# Patient Record
Sex: Male | Born: 1995
Health system: Southern US, Community
[De-identification: ages and names within clinical notes are randomized; demographics above are authoritative.]

---

## 2019-12-05 ENCOUNTER — Other Ambulatory Visit: Payer: Self-pay

## 2019-12-05 ENCOUNTER — Emergency Department (HOSPITAL_COMMUNITY)
Admission: EM | Admit: 2019-12-05 | Discharge: 2019-12-05 | Disposition: A | Discharge status: Home / Self Care | Payer: BC Managed Care – PPO | Attending: Emergency Medicine | Admitting: Emergency Medicine

## 2019-12-05 DIAGNOSIS — R10819 Abdominal tenderness, unspecified site: Secondary | ICD-10-CM | POA: Insufficient documentation

## 2019-12-05 DIAGNOSIS — R1010 Upper abdominal pain, unspecified: Secondary | ICD-10-CM | POA: Diagnosis not present

## 2019-12-05 DIAGNOSIS — R109 Unspecified abdominal pain: Secondary | ICD-10-CM

## 2019-12-05 DIAGNOSIS — R1011 Right upper quadrant pain: Secondary | ICD-10-CM | POA: Diagnosis not present

## 2019-12-05 LAB — URINALYSIS, ROUTINE W REFLEX MICROSCOPIC
Bilirubin Urine: NEGATIVE
Glucose, UA: NEGATIVE mg/dL
Hgb urine dipstick: NEGATIVE
Ketones, ur: NEGATIVE mg/dL
Leukocytes,Ua: NEGATIVE
Nitrite: NEGATIVE
Protein, ur: NEGATIVE mg/dL
Specific Gravity, Urine: 1.005 (ref 1.005–1.030)
pH: 7 (ref 5.0–8.0)

## 2019-12-05 LAB — COMPREHENSIVE METABOLIC PANEL
ALT: 42 U/L (ref 0–44)
AST: 26 U/L (ref 15–41)
Albumin: 4.4 g/dL (ref 3.5–5.0)
Alkaline Phosphatase: 56 U/L (ref 38–126)
Anion gap: 9 (ref 5–15)
BUN: 13 mg/dL (ref 6–20)
CO2: 28 mmol/L (ref 22–32)
Calcium: 9.1 mg/dL (ref 8.9–10.3)
Chloride: 103 mmol/L (ref 98–111)
Creatinine, Ser: 1.05 mg/dL (ref 0.61–1.24)
GFR calc Af Amer: >60 mL/min (ref >60)
GFR calc non Af Amer: >60 mL/min (ref >60)
Glucose, Bld: 93 mg/dL (ref 70–99)
Potassium: 3.4 mmol/L — ABNORMAL LOW (ref 3.5–5.1)
Sodium: 140 mmol/L (ref 135–145)
Total Bilirubin: 1.2 mg/dL (ref 0.3–1.2)
Total Protein: 7.6 g/dL (ref 6.5–8.1)

## 2019-12-05 LAB — CBC
HCT: 43.1 % (ref 39.0–52.0)
Hemoglobin: 14 g/dL (ref 13.0–17.0)
MCH: 29.9 pg (ref 26.0–34.0)
MCHC: 32.5 g/dL (ref 30.0–36.0)
MCV: 91.9 fL (ref 80.0–100.0)
Platelets: 241 10*3/uL (ref 150–400)
RBC: 4.69 MIL/uL (ref 4.22–5.81)
RDW: 11.4 % — ABNORMAL LOW (ref 11.5–15.5)
WBC: 5.6 10*3/uL (ref 4.0–10.5)
nRBC: 0 % (ref 0.0–0.2)

## 2019-12-05 LAB — LIPASE, BLOOD: Lipase: 30 U/L (ref 11–51)

## 2019-12-05 MED ORDER — HYDROCODONE-ACETAMINOPHEN 5-325 MG PO TABS: 1.0000 | ORAL_TABLET | Freq: Four times a day (QID) | ORAL | 0 refills | Status: AC | PRN

## 2019-12-05 NOTE — Discharge Instructions (Signed)
Per our discussion, I would recommend that you continue to use ibuprofen for management of your pain.  I prescribed you a short course of Vicodin if you have any breakthrough pain.  Please do not mix this with alcohol or operate a motor vehicle after taking it.  Please follow-up with [facility] gastroenterology next week.  Their contact information is in this paperwork.  If you develop any new or worsening symptoms feel free to return to the emergency department for reevaluation.  It was a pleasure to meet you.

## 2019-12-05 NOTE — ED Provider Notes (Signed)
[facility] EMERGENCY DEPARTMENT Provider Note   CSN: [id number] Arrival date & time: 12/05/19  1032     History Chief Complaint  Patient presents with  . Abdominal Pain    Peter Rosales is a 24 y.o. male.  HPI Patient is a 24 year old male with no significant medical history that presents due to abdominal pain.  Patient states 4 days ago he began experiencing some mild intermittent upper abdominal pain that was typically worse in the right upper quadrant.  His significant other notes some swelling in the region as well.  He states yesterday it became more of a constant waxing and waning pain.  Is currently mild.  He denies any other symptoms to go along with this pain.  No nausea or vomiting.  No fevers or chills.  No chest pain or shortness of breath.  He denies a history of similar symptoms.  He is not on any regular medications.  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     No past medical history on file.  There are no problems to display for this patient.    No family history on file.  Social History   Tobacco Use  . Smoking status: Not on file  Substance Use Topics  . Alcohol use: Not on file  . Drug use: Not on file    Home Medications Prior to Admission medications   Not on File    Allergies    Patient has no allergy information on record.  Review of Systems   Review of Systems  All other systems reviewed and are negative. Ten systems reviewed and are negative for acute change, except as noted in the HPI.   Physical Exam Updated Vital Signs BP (!) 144/75 (BP Location: Right Arm)   Pulse (!) 56   Temp 98.1 F (36.7 C) (Oral)   Resp 16   Ht 5\' 10"  (1.778 m)   Wt 86.2 kg   SpO2 100%   BMI 27.26 kg/m   Physical Exam Vitals and nursing note reviewed.  Constitutional:      General: He is not in acute distress.    Appearance: Normal appearance. He is not ill-appearing,  toxic-appearing or diaphoretic.  HENT:     Head: Normocephalic and atraumatic.     Right Ear: External ear normal.     Left Ear: External ear normal.     Nose: Nose normal.     Mouth/Throat:     Mouth: Mucous membranes are moist.     Pharynx: Oropharynx is clear. No oropharyngeal exudate or posterior oropharyngeal erythema.  Eyes:     Extraocular Movements: Extraocular movements intact.  Cardiovascular:     Rate and Rhythm: Normal rate and regular rhythm.     Pulses: Normal pulses.     Heart sounds: Normal heart sounds. No murmur heard.  No friction rub. No gallop.   Pulmonary:     Effort: Pulmonary effort is normal. No respiratory distress.     Breath sounds: Normal breath sounds. No stridor. No wheezing, rhonchi or rales.  Abdominal:     General: Abdomen is flat. There is no distension.     Tenderness: There is abdominal tenderness.     Comments: Abdomen is soft.  No rebound.  No guarding.  There is a very mild point of tenderness noted along the right side of the epigastrium.  No edema.  No distention.  No erythema.  Negative Murphy sign.  No obvious signs of herniation.  Musculoskeletal:        General: Normal range of motion.     Cervical back: Normal range of motion and neck supple. No tenderness.  Skin:    General: Skin is warm and dry.  Neurological:     General: No focal deficit present.     Mental Status: He is alert and oriented to person, place, and time.  Psychiatric:        Mood and Affect: Mood normal.        Behavior: Behavior normal.    ED Results / Procedures / Treatments   Labs (all labs ordered are listed, but only abnormal results are displayed) Labs Reviewed  COMPREHENSIVE METABOLIC PANEL - Abnormal; Notable for the following components:      Result Value   Potassium 3.4 (*)    All other components within normal limits  CBC - Abnormal; Notable for the following components:   RDW 11.4 (*)    All other components within normal limits  URINALYSIS,  ROUTINE W REFLEX MICROSCOPIC - Abnormal; Notable for the following components:   Color, Urine STRAW (*)    All other components within normal limits  LIPASE, BLOOD    EKG None  Radiology No results found.  Procedures Procedures (including critical care time)  Medications Ordered in ED Medications - No data to display  ED Course  I have reviewed the triage vital signs and the nursing notes.  Pertinent labs & imaging results that were available during my care of the patient were reviewed by me and considered in my medical decision making (see chart for details).    MDM Rules/Calculators/A&P                          Patient is a 24 year old male that presents with some mild intermittent upper abdominal pain.  Labs were obtained in triage and are reassuring.  UA, lipase, CBC was benign.  Mildly hypokalemic at 3.4.  We discussed supplementing this through his diet.  Patient is afebrile.  Not tachycardic.  His vital signs are stable and reassuring.  Due to the nature of his symptoms and his physical exam being reassuring, discussed not performing any imaging at today's visit.  He was amenable with this.  I'm going to give the patient a GI follow-up.  He plans on reaching out to them in a couple of days.  We'll give patient a short course of Vicodin.  We discussed safety regarding this medication.  I recommended continued use of ibuprofen for regular pain management.  He understands to take this medication with food to prevent stomach irritation.  He was given very strict return precautions.  His questions were answered and he was amicable to time of discharge.  His vital signs are stable.  Patient discharged to home/self care.  Condition at discharge: Stable  Note: Portions of this report may have been transcribed using voice recognition software. Every effort was made to ensure accuracy; however, inadvertent computerized transcription errors may be present.    Final Clinical  Impression(s) / ED Diagnoses Final diagnoses:  Abdominal pain, unspecified abdominal location    Rx / DC Orders ED Discharge Orders         Ordered    HYDROcodone-acetaminophen (NORCO/VICODIN) 5-325 MG tablet  Every 6 hours PRN     Discontinue     12/05/19 1231  Placido Sou, PA-C 12/05/19 1234    Mancel Bale, MD 12/05/19 [phone]

## 2019-12-05 NOTE — ED Triage Notes (Signed)
Pt here for eval of RUQ pain and swelling, worsening since Wednesday. Denies n/v/d.

## 2019-12-31 DIAGNOSIS — R1900 Intra-abdominal and pelvic swelling, mass and lump, unspecified site: Secondary | ICD-10-CM | POA: Diagnosis not present

## 2019-12-31 DIAGNOSIS — S39011A Strain of muscle, fascia and tendon of abdomen, initial encounter: Secondary | ICD-10-CM | POA: Diagnosis not present

## 2020-01-04 ENCOUNTER — Other Ambulatory Visit: Payer: Self-pay | Admitting: Urgent Care

## 2020-01-18 DIAGNOSIS — R1011 Right upper quadrant pain: Secondary | ICD-10-CM | POA: Diagnosis not present

## 2020-01-18 DIAGNOSIS — K625 Hemorrhage of anus and rectum: Secondary | ICD-10-CM | POA: Diagnosis not present

## 2020-01-19 ENCOUNTER — Other Ambulatory Visit: Payer: Self-pay | Admitting: Physician Assistant

## 2020-01-19 DIAGNOSIS — R1011 Right upper quadrant pain: Secondary | ICD-10-CM

## 2020-01-26 ENCOUNTER — Ambulatory Visit
Admission: RE | Admit: 2020-01-26 | Discharge: 2020-01-26 | Disposition: A | Discharge status: Home / Self Care | Payer: BC Managed Care – PPO | Source: Ambulatory Visit | Attending: Physician Assistant | Admitting: Physician Assistant

## 2020-01-26 DIAGNOSIS — R1011 Right upper quadrant pain: Secondary | ICD-10-CM | POA: Diagnosis not present

## 2020-01-27 DIAGNOSIS — K625 Hemorrhage of anus and rectum: Secondary | ICD-10-CM | POA: Diagnosis not present

## 2020-03-01 DIAGNOSIS — Z0000 Encounter for general adult medical examination without abnormal findings: Secondary | ICD-10-CM | POA: Diagnosis not present

## 2020-03-01 DIAGNOSIS — E876 Hypokalemia: Secondary | ICD-10-CM | POA: Diagnosis not present

## 2020-03-01 DIAGNOSIS — Z13220 Encounter for screening for lipoid disorders: Secondary | ICD-10-CM | POA: Diagnosis not present

## 2021-03-13 DIAGNOSIS — Z0000 Encounter for general adult medical examination without abnormal findings: Secondary | ICD-10-CM | POA: Diagnosis not present

## 2021-03-13 DIAGNOSIS — Z13220 Encounter for screening for lipoid disorders: Secondary | ICD-10-CM | POA: Diagnosis not present

## 2021-03-16 IMAGING — US US ABDOMEN LIMITED
1 series · 14 of 25 positions shown · non-contrast
Comparison: None

CLINICAL DATA: RIGHT upper quadrant abdominal pain for 1 month

EXAM:
ULTRASOUND ABDOMEN LIMITED RIGHT UPPER QUADRANT

[Series 1: us abdomen limited · 0.19mm/px · 14 of 47 slices shown]
[im 1/47]
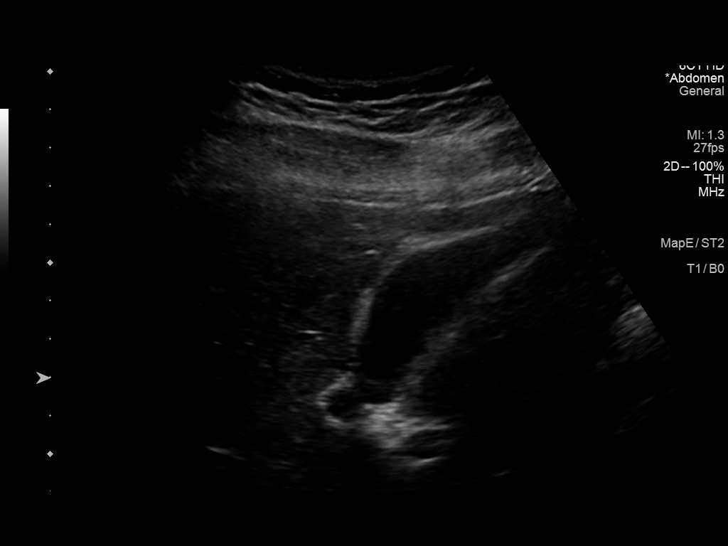
[im 4/47]
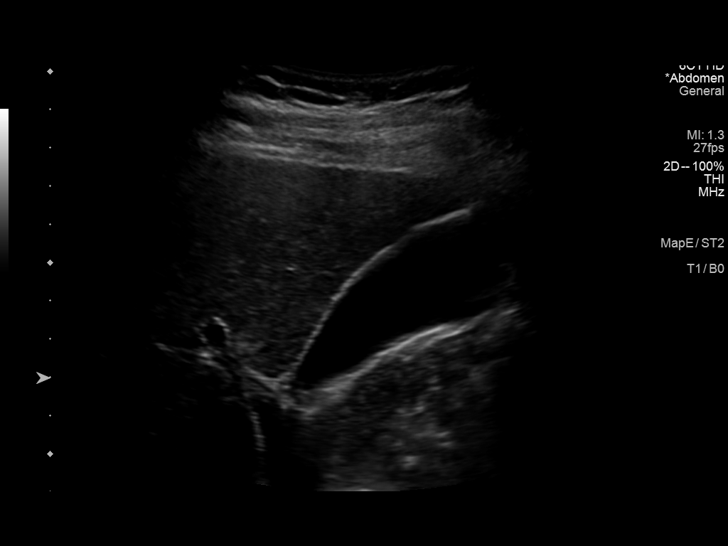
[im 8/47]
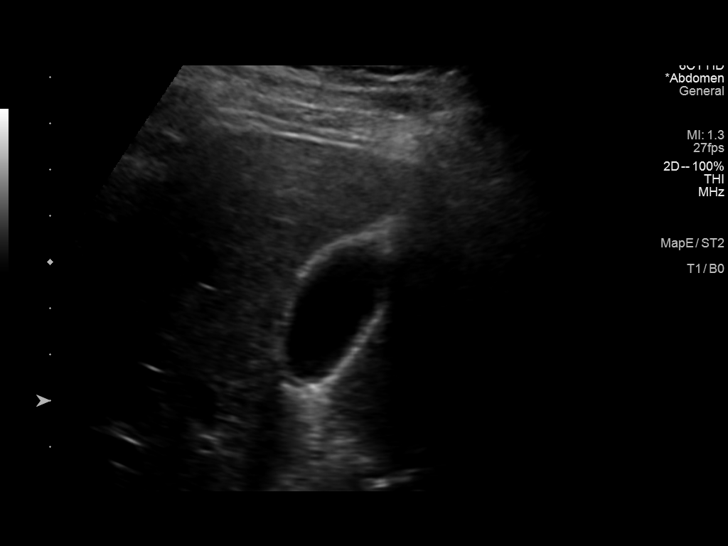
[im 12/47]
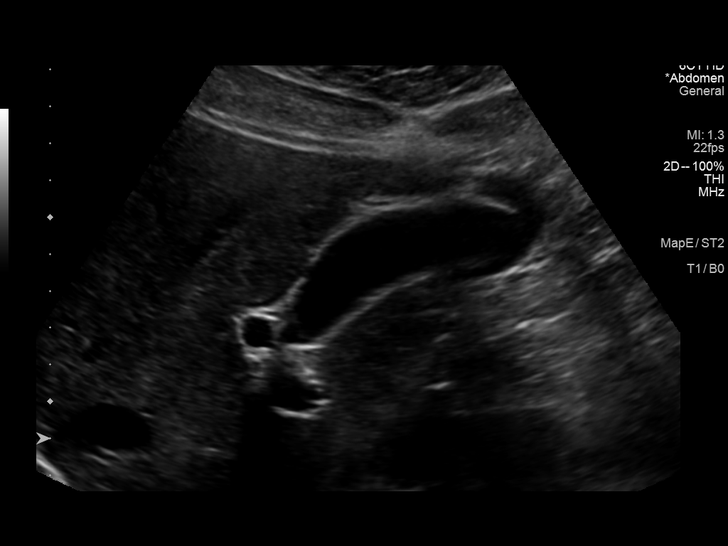
[im 16/47]
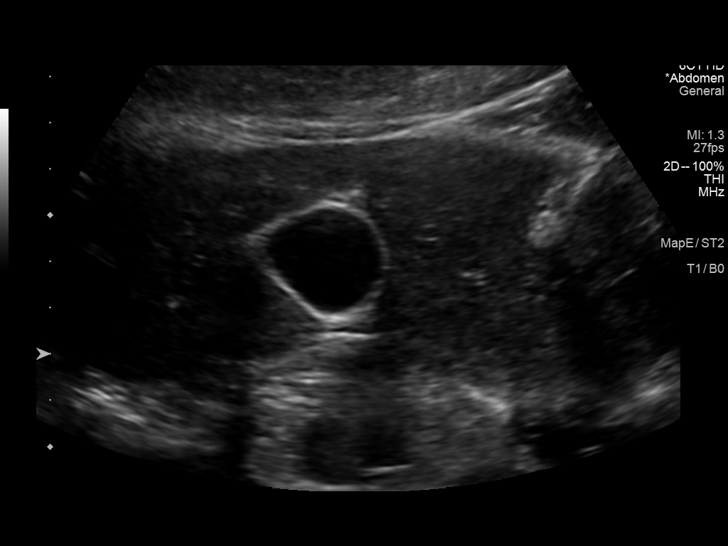
[im 18/47]
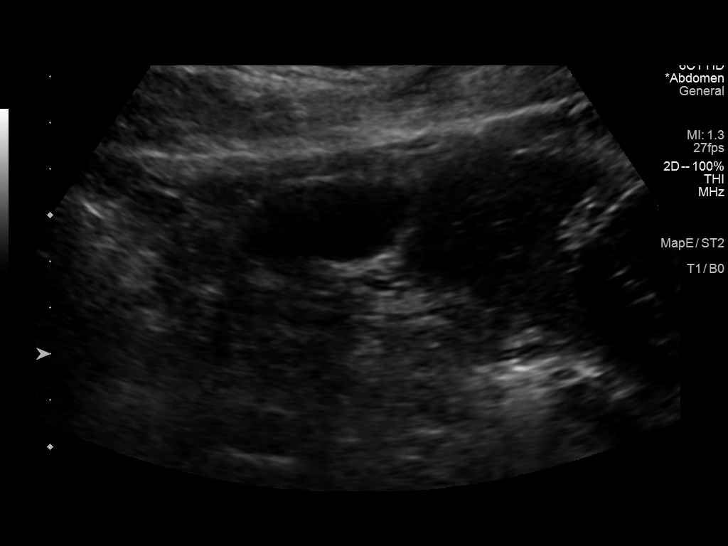
[im 22/47]
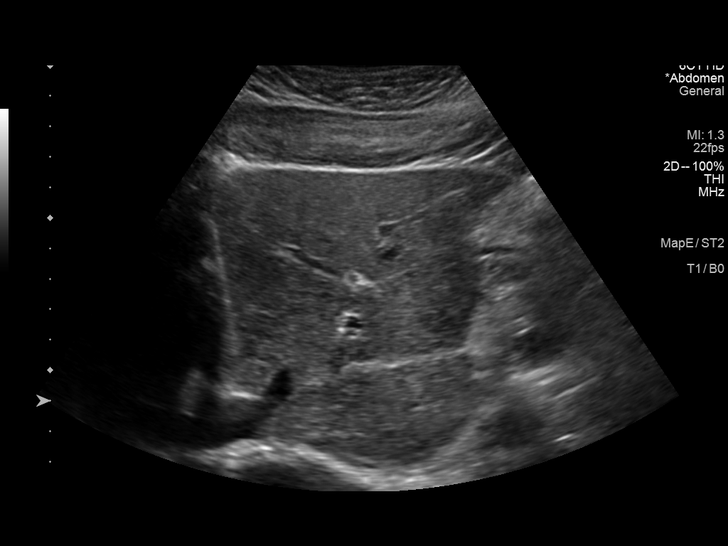
[im 25/47]
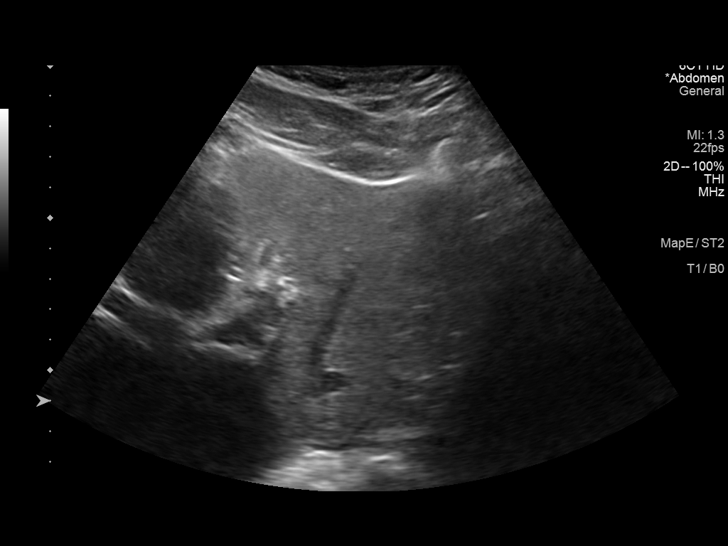
[im 29/47]
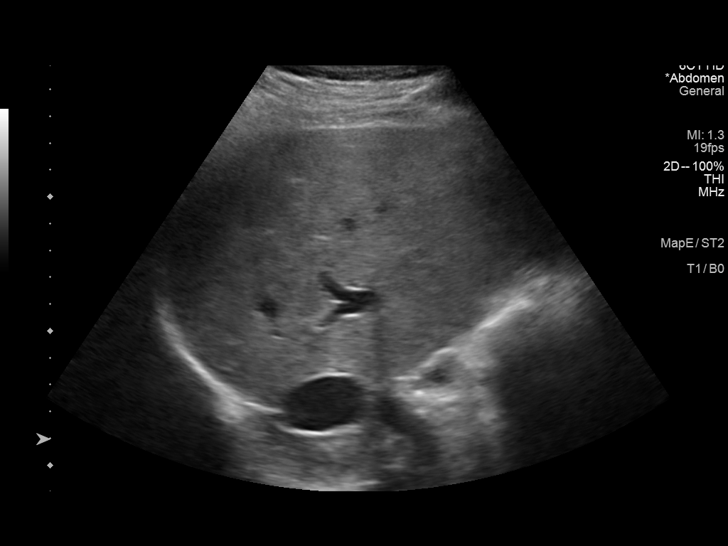
[im 31/47]
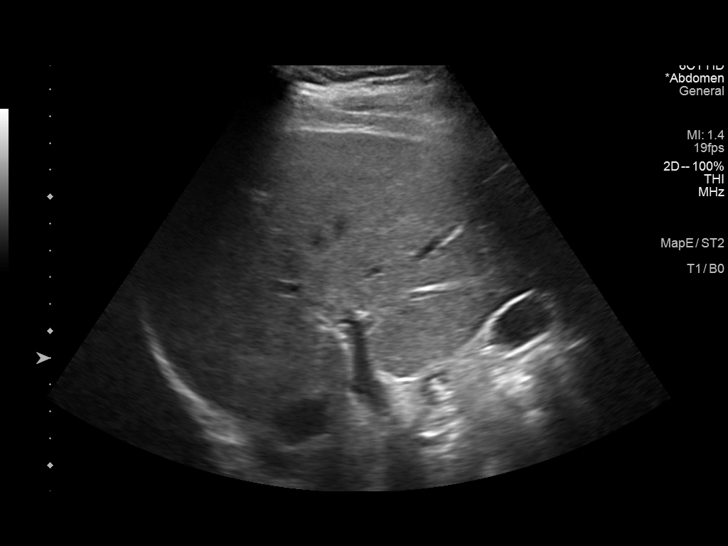
[im 35/47]
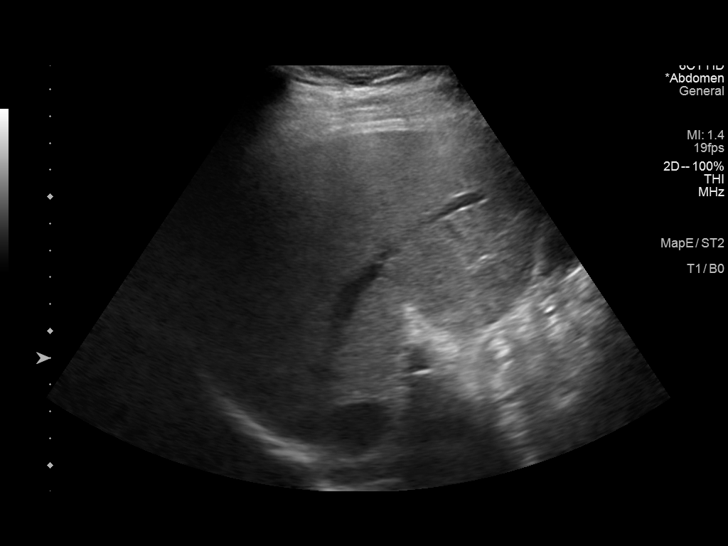
[im 39/47]
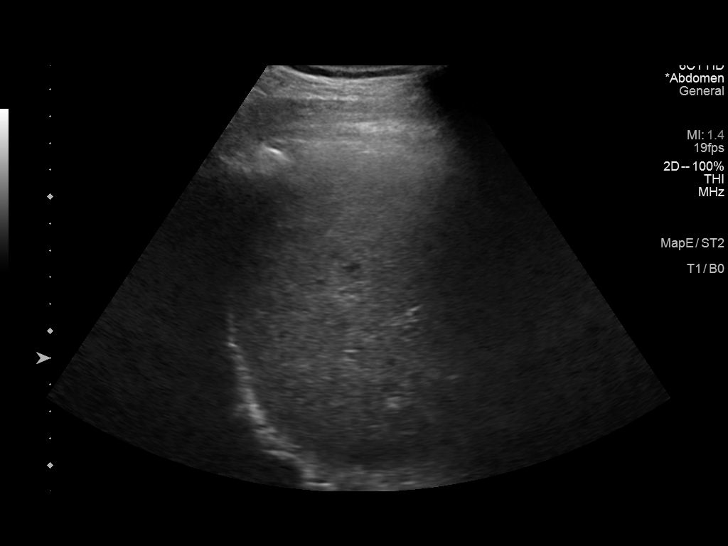
[im 43/47]
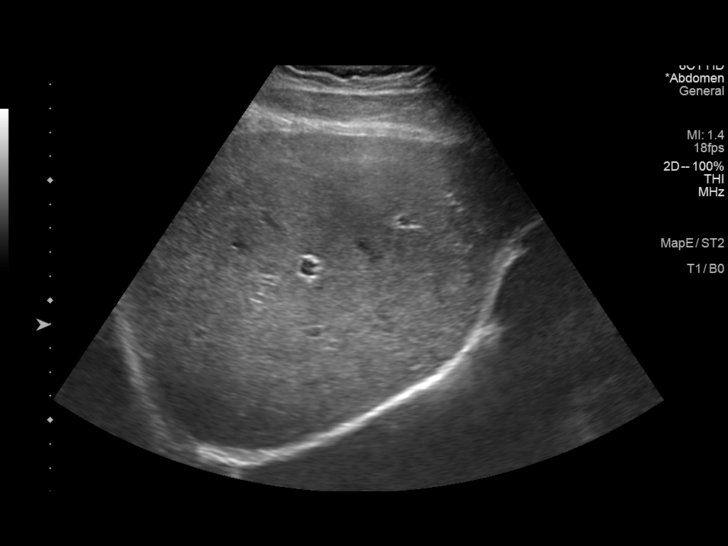
[im 47/47]
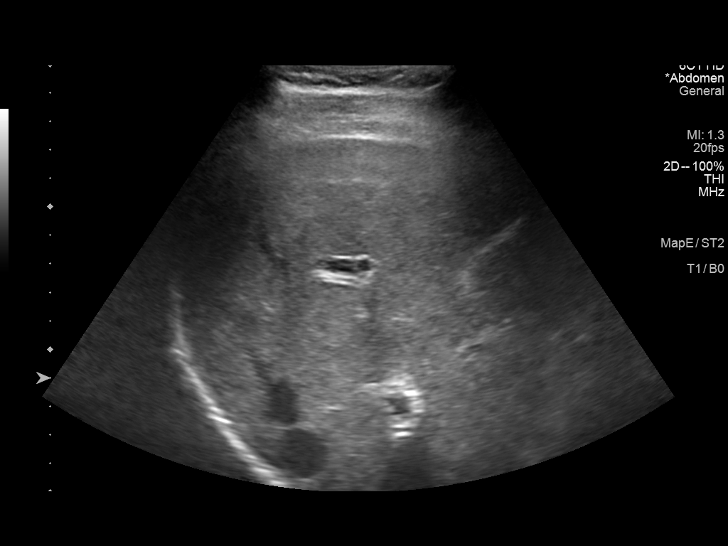

[14 of 25 positions shown; findings below may reference images not displayed]

FINDINGS: Gallbladder:

Normally distended without stones or wall thickening. No
pericholecystic fluid or sonographic Murphy sign.

Common bile duct:

Diameter: 3 mm, normal

Liver:

Upper normal echogenicity without mass or nodularity. No
intrahepatic biliary dilatation. Portal vein is patent on color
Doppler imaging with normal direction of blood flow towards the
liver.

Other: No RIGHT upper quadrant free fluid.
IMPRESSION: Normal exam.

## 2022-03-14 DIAGNOSIS — M542 Cervicalgia: Secondary | ICD-10-CM | POA: Diagnosis not present

## 2022-03-14 DIAGNOSIS — Z0000 Encounter for general adult medical examination without abnormal findings: Secondary | ICD-10-CM | POA: Diagnosis not present

## 2022-03-14 DIAGNOSIS — Z6829 Body mass index (BMI) 29.0-29.9, adult: Secondary | ICD-10-CM | POA: Diagnosis not present

## 2022-03-14 DIAGNOSIS — R1011 Right upper quadrant pain: Secondary | ICD-10-CM | POA: Diagnosis not present
